# Patient Record
Sex: Female | Born: 1960 | Race: White | Hispanic: No | Smoking: Never smoker
Health system: Southern US, Community
[De-identification: ages and names within clinical notes are randomized; demographics above are authoritative.]

## PROBLEM LIST (undated history)

## (undated) DIAGNOSIS — M1990 Unspecified osteoarthritis, unspecified site: Secondary | ICD-10-CM

## (undated) DIAGNOSIS — IMO0001 Reserved for inherently not codable concepts without codable children: Secondary | ICD-10-CM

## (undated) DIAGNOSIS — F32A Depression, unspecified: Secondary | ICD-10-CM

## (undated) DIAGNOSIS — F329 Major depressive disorder, single episode, unspecified: Secondary | ICD-10-CM

## (undated) DIAGNOSIS — F319 Bipolar disorder, unspecified: Secondary | ICD-10-CM

## (undated) DIAGNOSIS — I10 Essential (primary) hypertension: Secondary | ICD-10-CM

## (undated) HISTORY — PX: KNEE SURGERY: SHX244

## (undated) HISTORY — PX: NECK SURGERY: SHX720

## (undated) HISTORY — PX: CARPAL TUNNEL RELEASE: SHX101

---

## 2014-08-04 ENCOUNTER — Other Ambulatory Visit: Payer: Self-pay | Admitting: Family Medicine

## 2014-08-04 DIAGNOSIS — Z1231 Encounter for screening mammogram for malignant neoplasm of breast: Secondary | ICD-10-CM

## 2014-08-13 ENCOUNTER — Ambulatory Visit: Payer: Self-pay

## 2015-04-06 ENCOUNTER — Emergency Department (HOSPITAL_COMMUNITY)
Admission: EM | Admit: 2015-04-06 | Discharge: 2015-04-07 | Disposition: A | Discharge status: Home / Self Care | Payer: Medicare HMO | Attending: Emergency Medicine | Admitting: Emergency Medicine

## 2015-04-06 ENCOUNTER — Encounter (HOSPITAL_COMMUNITY): Payer: Self-pay

## 2015-04-06 DIAGNOSIS — I10 Essential (primary) hypertension: Secondary | ICD-10-CM | POA: Diagnosis not present

## 2015-04-06 DIAGNOSIS — R002 Palpitations: Secondary | ICD-10-CM | POA: Diagnosis not present

## 2015-04-06 DIAGNOSIS — J069 Acute upper respiratory infection, unspecified: Secondary | ICD-10-CM | POA: Diagnosis not present

## 2015-04-06 DIAGNOSIS — H61893 Other specified disorders of external ear, bilateral: Secondary | ICD-10-CM | POA: Insufficient documentation

## 2015-04-06 DIAGNOSIS — R42 Dizziness and giddiness: Secondary | ICD-10-CM | POA: Diagnosis not present

## 2015-04-06 DIAGNOSIS — Z8739 Personal history of other diseases of the musculoskeletal system and connective tissue: Secondary | ICD-10-CM | POA: Diagnosis not present

## 2015-04-06 DIAGNOSIS — E669 Obesity, unspecified: Secondary | ICD-10-CM | POA: Diagnosis not present

## 2015-04-06 DIAGNOSIS — Z8659 Personal history of other mental and behavioral disorders: Secondary | ICD-10-CM | POA: Insufficient documentation

## 2015-04-06 DIAGNOSIS — Z880 Allergy status to penicillin: Secondary | ICD-10-CM | POA: Insufficient documentation

## 2015-04-06 DIAGNOSIS — R51 Headache: Secondary | ICD-10-CM | POA: Diagnosis present

## 2015-04-06 DIAGNOSIS — Z8679 Personal history of other diseases of the circulatory system: Secondary | ICD-10-CM

## 2015-04-06 HISTORY — DX: Reserved for inherently not codable concepts without codable children: IMO0001

## 2015-04-06 HISTORY — DX: Unspecified osteoarthritis, unspecified site: M19.90

## 2015-04-06 HISTORY — DX: Bipolar disorder, unspecified: F31.9

## 2015-04-06 HISTORY — DX: Major depressive disorder, single episode, unspecified: F32.9

## 2015-04-06 HISTORY — DX: Essential (primary) hypertension: I10

## 2015-04-06 HISTORY — DX: Depression, unspecified: F32.A

## 2015-04-06 NOTE — ED Notes (Signed)
Patient complains of headache, dizziness, and nausea x2 weeks. States her BP has been going up in mid day for last 3 weeks. States she called her PCP and they told her to come to ED for evaluation. NAD noted in triage.

## 2015-04-07 LAB — BASIC METABOLIC PANEL
ANION GAP: 8 (ref 5–15)
BUN: 20 mg/dL (ref 6–20)
CALCIUM: 10 mg/dL (ref 8.9–10.3)
CO2: 25 mmol/L (ref 22–32)
CREATININE: 0.75 mg/dL (ref 0.44–1.00)
Chloride: 106 mmol/L (ref 101–111)
Glucose, Bld: 110 mg/dL — ABNORMAL HIGH (ref 65–99)
Potassium: 4.2 mmol/L (ref 3.5–5.1)
SODIUM: 139 mmol/L (ref 135–145)

## 2015-04-07 LAB — CBC WITH DIFFERENTIAL/PLATELET
BASOS ABS: 0.1 10*3/uL (ref 0.0–0.1)
BASOS PCT: 1 %
EOS ABS: 0.4 10*3/uL (ref 0.0–0.7)
EOS PCT: 4 %
HEMATOCRIT: 41 % (ref 36.0–46.0)
Hemoglobin: 13.7 g/dL (ref 12.0–15.0)
Lymphocytes Relative: 43 %
Lymphs Abs: 4.4 10*3/uL — ABNORMAL HIGH (ref 0.7–4.0)
MCH: 29.5 pg (ref 26.0–34.0)
MCHC: 33.4 g/dL (ref 30.0–36.0)
MCV: 88.4 fL (ref 78.0–100.0)
MONO ABS: 0.6 10*3/uL (ref 0.1–1.0)
MONOS PCT: 6 %
NEUTROS ABS: 4.8 10*3/uL (ref 1.7–7.7)
Neutrophils Relative %: 46 %
PLATELETS: 209 10*3/uL (ref 150–400)
RBC: 4.64 MIL/uL (ref 3.87–5.11)
RDW: 13.3 % (ref 11.5–15.5)
WBC: 10.2 10*3/uL (ref 4.0–10.5)

## 2015-04-07 MED ORDER — MECLIZINE HCL 12.5 MG PO TABS
25.0000 mg | ORAL_TABLET | Freq: Once | ORAL | Status: AC
  Administered 2015-04-07: 25 mg via ORAL
  Filled 2015-04-07: qty 2

## 2015-04-07 MED ORDER — MECLIZINE HCL 25 MG PO TABS: 25.0000 mg | ORAL_TABLET | Freq: Two times a day (BID) | ORAL | Status: AC | PRN

## 2015-04-07 MED ORDER — DIPHENHYDRAMINE HCL 50 MG/ML IJ SOLN
25.0000 mg | Freq: Once | INTRAMUSCULAR | Status: AC
  Administered 2015-04-07: 25 mg via INTRAVENOUS
  Filled 2015-04-07: qty 1

## 2015-04-07 MED ORDER — SODIUM CHLORIDE 0.9 % IV BOLUS (SEPSIS)
1000.0000 mL | Freq: Once | INTRAVENOUS | Status: AC
  Administered 2015-04-07: 1000 mL via INTRAVENOUS

## 2015-04-07 MED ORDER — PROCHLORPERAZINE EDISYLATE 5 MG/ML IJ SOLN
10.0000 mg | Freq: Once | INTRAMUSCULAR | Status: AC
  Administered 2015-04-07: 10 mg via INTRAVENOUS
  Filled 2015-04-07: qty 2

## 2015-04-07 NOTE — ED Provider Notes (Signed)
CSN: [id number]     Arrival date & time 04/06/15  2240 History  By signing my name below, I, Budd Palmer, attest that this documentation has been prepared under the direction and in the presence of Shon Baton, MD. Electronically Signed: Budd Palmer, ED Scribe. 04/07/2015. 12:26 AM.    Chief Complaint  Patient presents with  . Headache   The history is provided by the patient. No language interpreter was used.   HPI Comments: Candice Rivera is a 55 y.o. female with a PMHx of HTN and a PSHx of neck surgery who presents to the Emergency Department complaining of constant, frontal headache onset 3 weeks ago. She currently rates her pain as a 6/10. She reports associated nausea, lightheadedness, dizziness (room spinning), palpitations, and HTN (151/95). She also endorses congestion, sinus pressure, cough, and wheezing, for which she has been using her inhaler without relief. She notes she was sick recently and that she has stopped taking all cold medications, thinking that they were causing the headache. She states she is taking tylenol for the headache without relief. She also reports a PMHx of neck pain due to a neck injury. She states her PCP is at [facility], with whom she has an appointment next week. Pt denies fever.    Past Medical History  Diagnosis Date  . Hypertension   . Bipolar 1 disorder (HCC)   . Depression   . Disc   . Arthritis    Past Surgical History  Procedure Laterality Date  . Neck surgery    . Knee surgery    . Carpal tunnel release     No family history on file. Social History  Substance Use Topics  . Smoking status: Never Smoker   . Smokeless tobacco: None  . Alcohol Use: Yes     Comment: occ   OB History    No data available     Review of Systems  Constitutional: Negative for fever.  HENT: Positive for congestion and sinus pressure.   Respiratory: Positive for cough and wheezing.   Cardiovascular: Positive for palpitations.   Neurological: Positive for dizziness, light-headedness and headaches.  All other systems reviewed and are negative.   Allergies  Ampicillin; Darvon; Erythromycin; Sulfa antibiotics; and Penicillins  Home Medications   Prior to Admission medications   Medication Sig Start Date End Date Taking? Authorizing Provider  meclizine (ANTIVERT) 25 MG tablet Take 1 tablet (25 mg total) by mouth 2 (two) times daily as needed for dizziness. 04/07/15   Shon Baton, MD   BP 168/91 mmHg  Pulse 74  Temp(Src) 98.4 F (36.9 C) (Oral)  Resp 17  Ht  (1.626 m)  Wt 281 lb (127.461 kg)  BMI 48.21 kg/m2  SpO2 98% Physical Exam  Constitutional: She is oriented to person, place, and time. She appears well-developed and well-nourished. No distress.  Obese  HENT:  Head: Normocephalic and atraumatic.  Mouth/Throat: Oropharynx is clear and moist.  Fluid behind bilateral ears, intact light reflex, no significant erythema  Eyes: EOM are normal. Pupils are equal, round, and reactive to light.  Cardiovascular: Normal rate, regular rhythm and normal heart sounds.   No murmur heard. Pulmonary/Chest: Effort normal and breath sounds normal. No respiratory distress. She has no wheezes.  Abdominal: Soft. Bowel sounds are normal. There is no tenderness. There is no rebound.  Neurological: She is alert and oriented to person, place, and time.  Cranial nerves II through XII intact, no dysmetria to finger-nose-finger  Skin: Skin  is warm and dry.  Psychiatric: She has a normal mood and affect.  Nursing note and vitals reviewed.   ED Course  Procedures  DIAGNOSTIC STUDIES: Oxygen Saturation is 99% on RA, normal by my interpretation.    COORDINATION OF CARE: 12:10 AM - Discussed probable vertigo and plans to order diagnostic studies. Pt advised of plan for treatment and pt agrees.  Labs Review Labs Reviewed  CBC WITH DIFFERENTIAL/PLATELET - Abnormal; Notable for the following:    Lymphs Abs 4.4 (*)     All other components within normal limits  BASIC METABOLIC PANEL - Abnormal; Notable for the following:    Glucose, Bld 110 (*)    All other components within normal limits    Imaging Review No results found. I have personally reviewed and evaluated these images and lab results as part of my medical decision-making.   EKG Interpretation None      MDM   Final diagnoses:  Vertigo  URI (upper respiratory infection)    Patient presents with dizziness, headache, URI symptoms. Ongoing and worsening over last 2 weeks. Initially thought this was related to her blood pressure. Initial blood pressure here 154/83. She is neurologically intact. Given recent URI symptoms and room spinning dizziness, suspect vertigo. URI may also be contributing to her headaches. Patient is not in severe hypertension range and doubt hypertensive emergency. Patient was given migraine cocktail as well as meclizine. She had complete resolution of her symptoms. She feels much better and ambulates independently. I discussed with patient continued supportive measures for her upper respiratory infection. She was encouraged to start nasal saline to drain the middle ear. She is also to follow-up with her primary physician regarding repeat blood pressure checks.  After history, exam, and medical workup I feel the patient has been appropriately medically screened and is safe for discharge home. Pertinent diagnoses were discussed with the patient. Patient was given return precautions.  I personally performed the services described in this documentation, which was scribed in my presence. The recorded information has been reviewed and is accurate.   Shon Baton, MD 04/07/15 [phone]

## 2015-04-07 NOTE — ED Notes (Signed)
Pt ambulated in hall with no assistance; pt ambulated to bathroom and back to bed

## 2015-04-07 NOTE — Discharge Instructions (Signed)
Benign Positional Vertigo °Vertigo is the feeling that you or your surroundings are moving when they are not. Benign positional vertigo is the most common form of vertigo. The cause of this condition is not serious (is benign). This condition is triggered by certain movements and positions (is positional). This condition can be dangerous if it occurs while you are doing something that could endanger you or others, such as driving.  °CAUSES °In many cases, the cause of this condition is not known. It may be caused by a disturbance in an area of the inner ear that helps your brain to sense movement and balance. This disturbance can be caused by a viral infection (labyrinthitis), head injury, or repetitive motion. °RISK FACTORS °This condition is more likely to develop in: °· Women. °· People who are 50 years of age or older. °SYMPTOMS °Symptoms of this condition usually happen when you move your head or your eyes in different directions. Symptoms may start suddenly, and they usually last for less than a minute. Symptoms may include: °· Loss of balance and falling. °· Feeling like you are spinning or moving. °· Feeling like your surroundings are spinning or moving. °· Nausea and vomiting. °· Blurred vision. °· Dizziness. °· Involuntary eye movement (nystagmus). °Symptoms can be mild and cause only slight annoyance, or they can be severe and interfere with daily life. Episodes of benign positional vertigo may return (recur) over time, and they may be triggered by certain movements. Symptoms may improve over time. °DIAGNOSIS °This condition is usually diagnosed by medical history and a physical exam of the head, neck, and ears. You may be referred to a health care provider who specializes in ear, nose, and throat (ENT) problems (otolaryngologist) or a provider who specializes in disorders of the nervous system (neurologist). You may have additional testing, including: °· MRI. °· A CT scan. °· Eye movement tests. Your  health care provider may ask you to change positions quickly while he or she watches you for symptoms of benign positional vertigo, such as nystagmus. Eye movement may be tested with an electronystagmogram (ENG), caloric stimulation, the Dix-Hallpike test, or the roll test. °· An electroencephalogram (EEG). This records electrical activity in your brain. °· Hearing tests. °TREATMENT °Usually, your health care provider will treat this by moving your head in specific positions to adjust your inner ear back to normal. Surgery may be needed in severe cases, but this is rare. In some cases, benign positional vertigo may resolve on its own in 2-4 weeks. °HOME CARE INSTRUCTIONS °Safety °· Move slowly. Avoid sudden body or head movements. °· Avoid driving. °· Avoid operating heavy machinery. °· Avoid doing any tasks that would be dangerous to you or others if a vertigo episode would occur. °· If you have trouble walking or keeping your balance, try using a cane for stability. If you feel dizzy or unstable, sit down right away. °· Return to your normal activities as told by your health care provider. Ask your health care provider what activities are safe for you. °General Instructions °· Take over-the-counter and prescription medicines only as told by your health care provider. °· Avoid certain positions or movements as told by your health care provider. °· Drink enough fluid to keep your urine clear or pale yellow. °· Keep all follow-up visits as told by your health care provider. This is important. °SEEK MEDICAL CARE IF: °· You have a fever. °· Your condition gets worse or you develop new symptoms. °· Your family or friends   notice any behavioral changes. °· Your nausea or vomiting gets worse. °· You have numbness or a "pins and needles" sensation. °SEEK IMMEDIATE MEDICAL CARE IF: °· You have difficulty speaking or moving. °· You are always dizzy. °· You faint. °· You develop severe headaches. °· You have weakness in your  legs or arms. °· You have changes in your hearing or vision. °· You develop a stiff neck. °· You develop sensitivity to light. °  °This information is not intended to replace advice given to you by your health care provider. Make sure you discuss any questions you have with your health care provider. °  °Document Released: 10/31/2005 Document Revised: 10/14/2014 Document Reviewed: 05/18/2014 °Elsevier Interactive Patient Education ©2016 [employer] °Upper Respiratory Infection, Adult °Most upper respiratory infections (URIs) are a viral infection of the air passages leading to the lungs. A URI affects the nose, throat, and upper air passages. The most common type of URI is nasopharyngitis and is typically referred to as "the common cold." °URIs run their course and usually go away on their own. Most of the time, a URI does not require medical attention, but sometimes a bacterial infection in the upper airways can follow a viral infection. This is called a secondary infection. Sinus and middle ear infections are common types of secondary upper respiratory infections. °Bacterial pneumonia can also complicate a URI. A URI can worsen asthma and chronic obstructive pulmonary disease (COPD). Sometimes, these complications can require emergency medical care and may be life threatening.  °CAUSES °Almost all URIs are caused by viruses. A virus is a type of germ and can spread from one person to another.  °RISKS FACTORS °You may be at risk for a URI if:  °· You smoke.   °· You have chronic heart or lung disease. °· You have a weakened defense (immune) system.   °· You are very young or very old.   °· You have nasal allergies or asthma. °· You work in crowded or poorly ventilated areas. °· You work in health care facilities or schools. °SIGNS AND SYMPTOMS  °Symptoms typically develop 2-3 days after you come in contact with a cold virus. Most viral URIs last 7-10 days. However, viral URIs from the influenza virus (flu virus)  can last 14-18 days and are typically more severe. Symptoms may include:  °· Runny or stuffy (congested) nose.   °· Sneezing.   °· Cough.   °· Sore throat.   °· Headache.   °· Fatigue.   °· Fever.   °· Loss of appetite.   °· Pain in your forehead, behind your eyes, and over your cheekbones (sinus pain). °· Muscle aches.   °DIAGNOSIS  °Your health care provider may diagnose a URI by: °· Physical exam. °· Tests to check that your symptoms are not due to another condition such as: °¨ Strep throat. °¨ Sinusitis. °¨ Pneumonia. °¨ Asthma. °TREATMENT  °A URI goes away on its own with time. It cannot be cured with medicines, but medicines may be prescribed or recommended to relieve symptoms. Medicines may help: °· Reduce your fever. °· Reduce your cough. °· Relieve nasal congestion. °HOME CARE INSTRUCTIONS  °· Take medicines only as directed by your health care provider.   °· Gargle warm saltwater or take cough drops to comfort your throat as directed by your health care provider. °· Use a warm mist humidifier or inhale steam from a shower to increase air moisture. This may make it easier to breathe. °· Drink enough fluid to keep your urine clear or pale yellow.   °·   Eat soups and other clear broths and maintain good nutrition.   °· Rest as needed.   °· Return to work when your temperature has returned to normal or as your health care provider advises. You may need to stay home longer to avoid infecting others. You can also use a face mask and careful hand washing to prevent spread of the virus. °· Increase the usage of your inhaler if you have asthma.   °· Do not use any tobacco products, including cigarettes, chewing tobacco, or electronic cigarettes. If you need help quitting, ask your health care provider. °PREVENTION  °The best way to protect yourself from getting a cold is to practice good hygiene.  °· Avoid oral or hand contact with people with cold symptoms.   °· Wash your hands often if contact occurs.   °There is  no clear evidence that vitamin C, vitamin E, echinacea, or exercise reduces the chance of developing a cold. However, it is always recommended to get plenty of rest, exercise, and practice good nutrition.  °SEEK MEDICAL CARE IF:  °· You are getting worse rather than better.   °· Your symptoms are not controlled by medicine.   °· You have chills. °· You have worsening shortness of breath. °· You have brown or red mucus. °· You have yellow or brown nasal discharge. °· You have pain in your face, especially when you bend forward. °· You have a fever. °· You have swollen neck glands. °· You have pain while swallowing. °· You have white areas in the back of your throat. °SEEK IMMEDIATE MEDICAL CARE IF:  °· You have severe or persistent: °¨ Headache. °¨ Ear pain. °¨ Sinus pain. °¨ Chest pain. °· You have chronic lung disease and any of the following: °¨ Wheezing. °¨ Prolonged cough. °¨ Coughing up blood. °¨ A change in your usual mucus. °· You have a stiff neck. °· You have changes in your: °¨ Vision. °¨ Hearing. °¨ Thinking. °¨ Mood. °MAKE SURE YOU:  °· Understand these instructions. °· Will watch your condition. °· Will get help right away if you are not doing well or get worse. °  °This information is not intended to replace advice given to you by your health care provider. Make sure you discuss any questions you have with your health care provider. °  °Document Released: 07/19/2000 Document Revised: 06/09/2014 Document Reviewed: 04/30/2013 °Elsevier Interactive Patient Education ©2016 [employer] ° °

## 2016-02-08 ENCOUNTER — Other Ambulatory Visit: Payer: Self-pay | Admitting: Family Medicine

## 2016-02-08 DIAGNOSIS — M79601 Pain in right arm: Principal | ICD-10-CM

## 2016-02-08 DIAGNOSIS — G8929 Other chronic pain: Secondary | ICD-10-CM

## 2016-02-10 ENCOUNTER — Ambulatory Visit
Admission: RE | Admit: 2016-02-10 | Discharge: 2016-02-10 | Disposition: A | Discharge status: Home / Self Care | Payer: Medicare HMO | Source: Ambulatory Visit | Attending: Family Medicine | Admitting: Family Medicine

## 2016-02-10 DIAGNOSIS — G8929 Other chronic pain: Secondary | ICD-10-CM

## 2016-02-10 DIAGNOSIS — M79601 Pain in right arm: Principal | ICD-10-CM

## 2017-02-07 ENCOUNTER — Other Ambulatory Visit: Payer: Self-pay | Admitting: Physician Assistant

## 2017-02-07 DIAGNOSIS — L309 Dermatitis, unspecified: Secondary | ICD-10-CM

## 2017-02-07 DIAGNOSIS — N644 Mastodynia: Secondary | ICD-10-CM

## 2017-02-13 ENCOUNTER — Ambulatory Visit
Admission: RE | Admit: 2017-02-13 | Discharge: 2017-02-13 | Disposition: A | Discharge status: Home / Self Care | Payer: Medicare HMO | Source: Ambulatory Visit | Attending: Physician Assistant | Admitting: Physician Assistant

## 2017-02-13 ENCOUNTER — Ambulatory Visit: Payer: Medicare HMO

## 2017-02-13 DIAGNOSIS — N644 Mastodynia: Secondary | ICD-10-CM

## 2017-02-13 DIAGNOSIS — L309 Dermatitis, unspecified: Secondary | ICD-10-CM

## 2019-11-28 ENCOUNTER — Other Ambulatory Visit: Payer: Self-pay | Admitting: Family Medicine

## 2019-11-28 DIAGNOSIS — Z1231 Encounter for screening mammogram for malignant neoplasm of breast: Secondary | ICD-10-CM

## 2019-12-30 ENCOUNTER — Other Ambulatory Visit: Payer: Self-pay

## 2019-12-30 ENCOUNTER — Ambulatory Visit
Admission: RE | Admit: 2019-12-30 | Discharge: 2019-12-30 | Disposition: A | Discharge status: Home / Self Care | Payer: Medicare Other | Source: Ambulatory Visit | Attending: Family Medicine | Admitting: Family Medicine

## 2019-12-30 DIAGNOSIS — Z1231 Encounter for screening mammogram for malignant neoplasm of breast: Secondary | ICD-10-CM

## 2020-07-20 ENCOUNTER — Other Ambulatory Visit: Payer: Self-pay | Admitting: Neurosurgery

## 2020-07-20 DIAGNOSIS — M5416 Radiculopathy, lumbar region: Secondary | ICD-10-CM

## 2020-08-21 ENCOUNTER — Ambulatory Visit
Admission: RE | Admit: 2020-08-21 | Discharge: 2020-08-21 | Disposition: A | Discharge status: Home / Self Care | Payer: Medicare Other | Source: Ambulatory Visit | Attending: Neurosurgery | Admitting: Neurosurgery

## 2020-08-21 ENCOUNTER — Other Ambulatory Visit: Payer: Self-pay

## 2020-08-21 DIAGNOSIS — M5416 Radiculopathy, lumbar region: Secondary | ICD-10-CM

## 2021-05-23 ENCOUNTER — Other Ambulatory Visit: Payer: Self-pay | Admitting: Family Medicine

## 2021-05-23 DIAGNOSIS — Z1231 Encounter for screening mammogram for malignant neoplasm of breast: Secondary | ICD-10-CM

## 2021-06-09 ENCOUNTER — Ambulatory Visit: Payer: Medicare Other

## 2021-06-10 ENCOUNTER — Ambulatory Visit
Admission: RE | Admit: 2021-06-10 | Discharge: 2021-06-10 | Disposition: A | Discharge status: Home / Self Care | Payer: Medicare Other | Source: Ambulatory Visit | Attending: Family Medicine | Admitting: Family Medicine

## 2021-06-10 DIAGNOSIS — Z1231 Encounter for screening mammogram for malignant neoplasm of breast: Secondary | ICD-10-CM

## 2022-07-13 ENCOUNTER — Other Ambulatory Visit: Payer: Self-pay | Admitting: Family Medicine

## 2022-07-13 DIAGNOSIS — Z1231 Encounter for screening mammogram for malignant neoplasm of breast: Secondary | ICD-10-CM

## 2022-08-15 ENCOUNTER — Ambulatory Visit
Admission: RE | Admit: 2022-08-15 | Discharge: 2022-08-15 | Disposition: A | Discharge status: Home / Self Care | Payer: 59 | Source: Ambulatory Visit | Attending: Family Medicine | Admitting: Family Medicine

## 2022-08-15 DIAGNOSIS — Z1231 Encounter for screening mammogram for malignant neoplasm of breast: Secondary | ICD-10-CM

## 2022-09-14 IMAGING — MG MM DIGITAL SCREENING BILAT W/ TOMO AND CAD
6 of 12 series · 6 of 36 positions shown · non-contrast
Comparison: Previous exam(s).

CLINICAL DATA: Screening.

EXAM:
DIGITAL SCREENING BILATERAL MAMMOGRAM WITH TOMOSYNTHESIS AND CAD
TECHNIQUE: Bilateral screening digital craniocaudal and mediolateral oblique
mammograms were obtained. Bilateral screening digital breast
tomosynthesis was performed. The images were evaluated with
computer-aided detection.

[L CC synth-2D (1 of 2)]
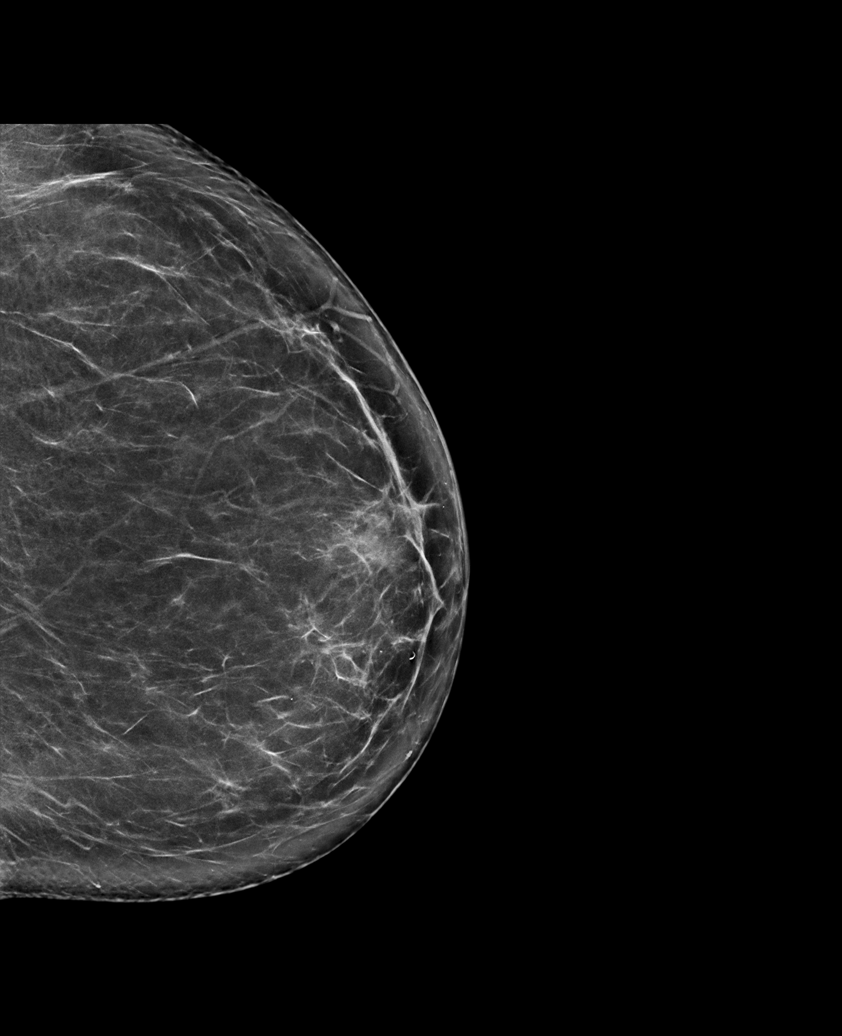

[R CC synth-2D (1 of 2)]
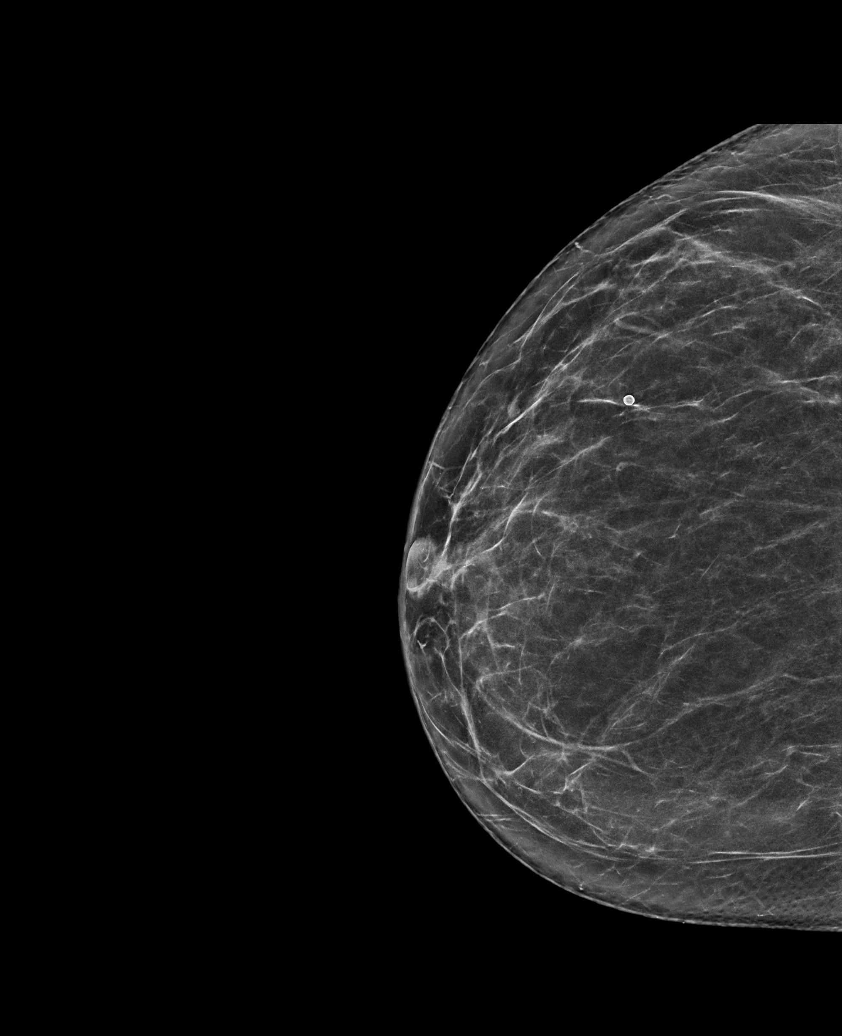

[R CC synth-2D (2 of 2)]
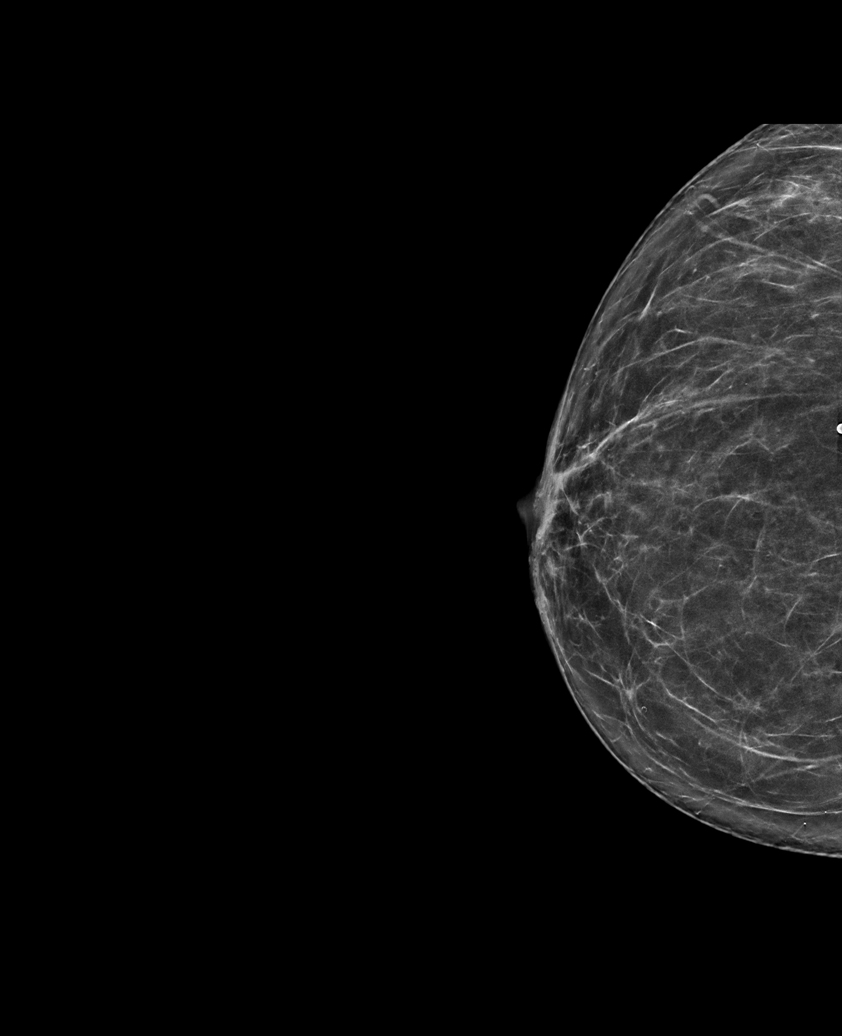

[L CC synth-2D (2 of 2)]
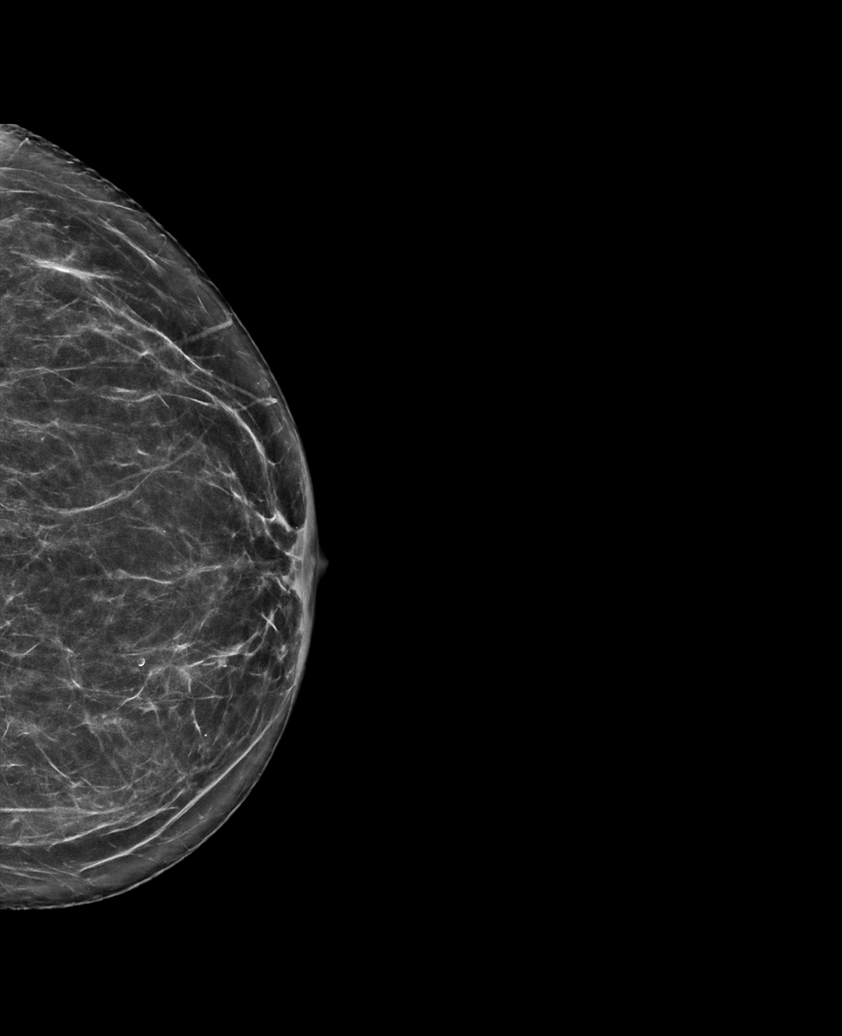

[L MLO synth-2D]
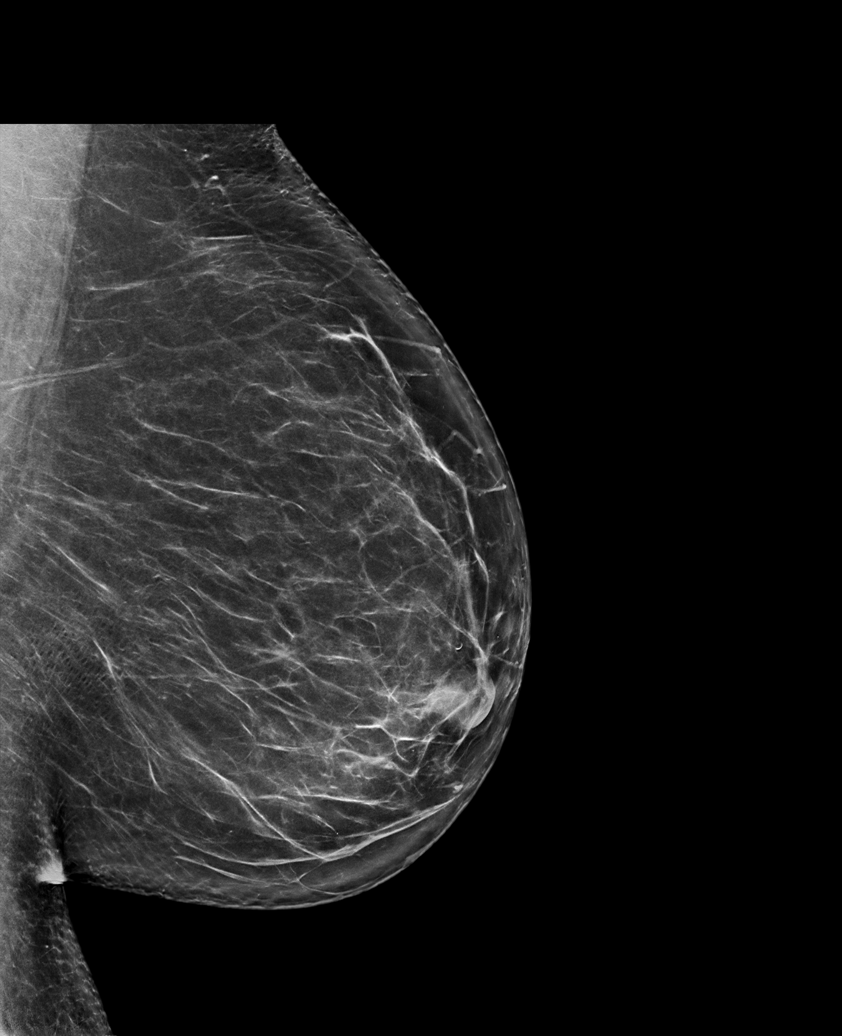

[R MLO synth-2D]
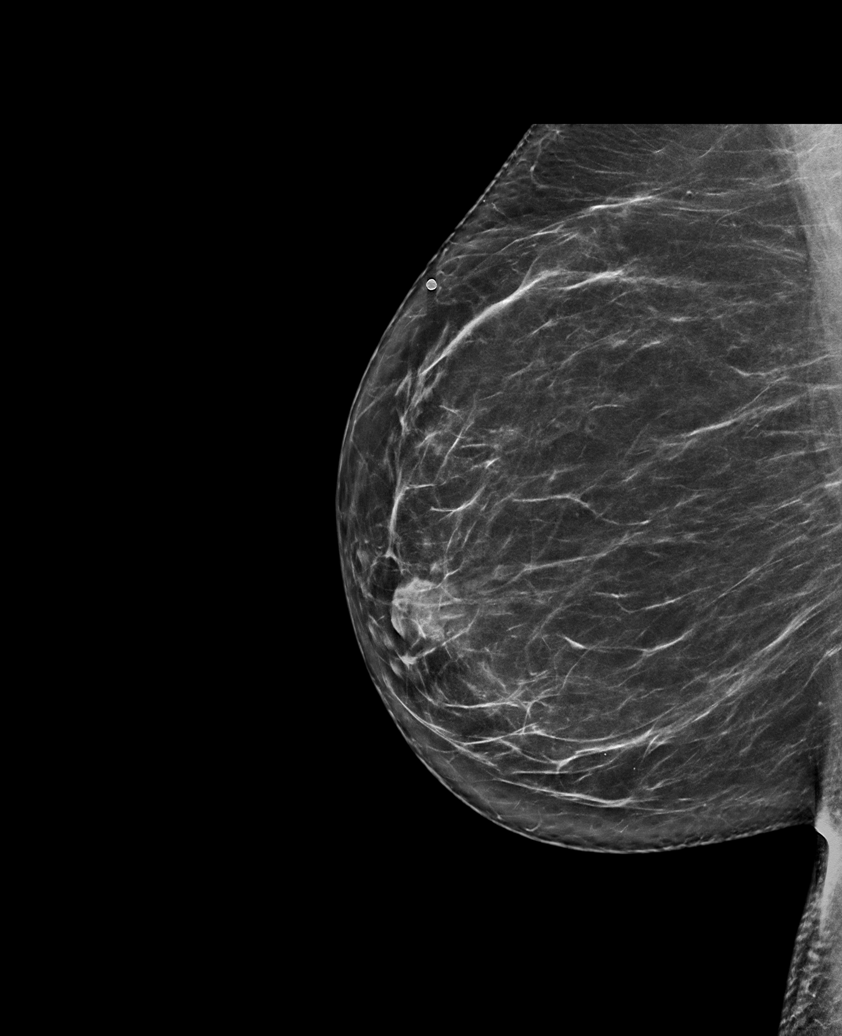

[6 of 36 positions shown; findings below may reference images not displayed]

ACR Breast Density Category b: There are scattered areas of
fibroglandular density.
FINDINGS: There are no findings suspicious for malignancy.
IMPRESSION: No mammographic evidence of malignancy. A result letter of this
screening mammogram will be mailed directly to the patient.

RECOMMENDATION:
Screening mammogram in one year. (Code:51-O-LD2)

BI-RADS CATEGORY  1: Negative.
